# Patient Record
Sex: Female | Born: 1965 | Race: Black or African American | Hispanic: No | Marital: Married | Smoking: Never smoker
Health system: Southern US, Community
[De-identification: ages and names within clinical notes are randomized; demographics above are authoritative.]

## PROBLEM LIST (undated history)

## (undated) DIAGNOSIS — I10 Essential (primary) hypertension: Secondary | ICD-10-CM

## (undated) DIAGNOSIS — E119 Type 2 diabetes mellitus without complications: Secondary | ICD-10-CM

## (undated) DIAGNOSIS — N83209 Unspecified ovarian cyst, unspecified side: Secondary | ICD-10-CM

## (undated) HISTORY — DX: Type 2 diabetes mellitus without complications: E11.9

---

## 2014-01-27 DIAGNOSIS — I10 Essential (primary) hypertension: Secondary | ICD-10-CM | POA: Insufficient documentation

## 2014-08-28 ENCOUNTER — Emergency Department
Admission: EM | Admit: 2014-08-28 | Discharge: 2014-08-28 | Disposition: A | Discharge status: Home / Self Care | Payer: BC Managed Care – PPO | Attending: Emergency Medicine | Admitting: Emergency Medicine

## 2014-08-28 ENCOUNTER — Emergency Department: Payer: BC Managed Care – PPO

## 2014-08-28 ENCOUNTER — Encounter: Payer: Self-pay | Admitting: Emergency Medicine

## 2014-08-28 DIAGNOSIS — R109 Unspecified abdominal pain: Secondary | ICD-10-CM | POA: Insufficient documentation

## 2014-08-28 DIAGNOSIS — R1031 Right lower quadrant pain: Secondary | ICD-10-CM | POA: Diagnosis present

## 2014-08-28 DIAGNOSIS — Z3202 Encounter for pregnancy test, result negative: Secondary | ICD-10-CM | POA: Diagnosis not present

## 2014-08-28 DIAGNOSIS — I10 Essential (primary) hypertension: Secondary | ICD-10-CM | POA: Insufficient documentation

## 2014-08-28 DIAGNOSIS — R102 Pelvic and perineal pain: Secondary | ICD-10-CM

## 2014-08-28 HISTORY — DX: Essential (primary) hypertension: I10

## 2014-08-28 LAB — URINALYSIS COMPLETE WITH MICROSCOPIC (ARMC ONLY)
Bilirubin Urine: NEGATIVE
Glucose, UA: 150 mg/dL — AB
Ketones, ur: NEGATIVE mg/dL
Leukocytes, UA: NEGATIVE
NITRITE: NEGATIVE
PROTEIN: NEGATIVE mg/dL
SPECIFIC GRAVITY, URINE: 1.014 (ref 1.005–1.030)
pH: 7 (ref 5.0–8.0)

## 2014-08-28 LAB — CBC WITH DIFFERENTIAL/PLATELET
BASOS PCT: 1 %
Basophils Absolute: 0.1 10*3/uL (ref 0–0.1)
Eosinophils Absolute: 0.1 10*3/uL (ref 0–0.7)
Eosinophils Relative: 2 %
HCT: 36.2 % (ref 35.0–47.0)
HEMOGLOBIN: 11.5 g/dL — AB (ref 12.0–16.0)
LYMPHS PCT: 31 %
Lymphs Abs: 2.5 10*3/uL (ref 1.0–3.6)
MCH: 23.9 pg — AB (ref 26.0–34.0)
MCHC: 31.9 g/dL — ABNORMAL LOW (ref 32.0–36.0)
MCV: 75 fL — AB (ref 80.0–100.0)
MONO ABS: 0.5 10*3/uL (ref 0.2–0.9)
Monocytes Relative: 7 %
Neutro Abs: 4.8 10*3/uL (ref 1.4–6.5)
Neutrophils Relative %: 59 %
Platelets: 238 10*3/uL (ref 150–440)
RBC: 4.82 MIL/uL (ref 3.80–5.20)
RDW: 16.2 % — ABNORMAL HIGH (ref 11.5–14.5)
WBC: 8.1 10*3/uL (ref 3.6–11.0)

## 2014-08-28 LAB — TROPONIN I

## 2014-08-28 LAB — LIPASE, BLOOD: LIPASE: 24 U/L (ref 22–51)

## 2014-08-28 LAB — COMPREHENSIVE METABOLIC PANEL
ALBUMIN: 3.9 g/dL (ref 3.5–5.0)
ALK PHOS: 58 U/L (ref 38–126)
ALT: 22 U/L (ref 14–54)
AST: 21 U/L (ref 15–41)
Anion gap: 8 (ref 5–15)
BUN: 10 mg/dL (ref 6–20)
CO2: 25 mmol/L (ref 22–32)
CREATININE: 0.69 mg/dL (ref 0.44–1.00)
Calcium: 8.5 mg/dL — ABNORMAL LOW (ref 8.9–10.3)
Chloride: 105 mmol/L (ref 101–111)
Glucose, Bld: 152 mg/dL — ABNORMAL HIGH (ref 65–99)
Potassium: 3.5 mmol/L (ref 3.5–5.1)
Sodium: 138 mmol/L (ref 135–145)
Total Bilirubin: 0.3 mg/dL (ref 0.3–1.2)
Total Protein: 7.6 g/dL (ref 6.5–8.1)

## 2014-08-28 LAB — POCT PREGNANCY, URINE: PREG TEST UR: NEGATIVE

## 2014-08-28 MED ORDER — IOHEXOL 350 MG/ML SOLN
100.0000 mL | Freq: Once | INTRAVENOUS | Status: AC | PRN
Start: 2014-08-28 — End: 2014-08-28
  Administered 2014-08-28: 100 mL via INTRAVENOUS

## 2014-08-28 MED ORDER — HYDROCODONE-ACETAMINOPHEN 5-325 MG PO TABS: 1.0000 | ORAL_TABLET | ORAL | Status: AC | PRN

## 2014-08-28 MED ORDER — IOHEXOL 240 MG/ML SOLN
25.0000 mL | Freq: Once | INTRAMUSCULAR | Status: AC | PRN
  Administered 2014-08-28: 25 mL via ORAL

## 2014-08-28 MED ORDER — HYDROMORPHONE HCL 1 MG/ML IJ SOLN
1.0000 mg | Freq: Once | INTRAMUSCULAR | Status: AC
  Administered 2014-08-28: 1 mg via INTRAVENOUS
  Filled 2014-08-28: qty 1

## 2014-08-28 MED ORDER — MORPHINE SULFATE 4 MG/ML IJ SOLN
4.0000 mg | Freq: Once | INTRAMUSCULAR | Status: AC
  Administered 2014-08-28: 4 mg via INTRAVENOUS
  Filled 2014-08-28: qty 1

## 2014-08-28 MED ORDER — SODIUM CHLORIDE 0.9 % IV BOLUS (SEPSIS)
1000.0000 mL | Freq: Once | INTRAVENOUS | Status: AC
  Administered 2014-08-28: 1000 mL via INTRAVENOUS

## 2014-08-28 MED ORDER — ONDANSETRON HCL 4 MG/2ML IJ SOLN
4.0000 mg | Freq: Once | INTRAMUSCULAR | Status: AC
  Administered 2014-08-28: 4 mg via INTRAVENOUS
  Filled 2014-08-28: qty 2

## 2014-08-28 NOTE — ED Notes (Signed)
Pt here with sudden onset of  Right sided abd pain.

## 2014-08-28 NOTE — Discharge Instructions (Signed)
Please seek medical attention for any high fevers, chest pain, shortness of breath, change in behavior, persistent vomiting, bloody stool or any other new or concerning symptoms. ° °Abdominal Pain, Women °Abdominal (stomach, pelvic, or belly) pain can be caused by many things. It is important to tell your doctor: °· The location of the pain. °· Does it come and go or is it present all the time? °· Are there things that start the pain (eating certain foods, exercise)? °· Are there other symptoms associated with the pain (fever, nausea, vomiting, diarrhea)? °All of this is helpful to know when trying to find the cause of the pain. °CAUSES  °· Stomach: virus or bacteria infection, or ulcer. °· Intestine: appendicitis (inflamed appendix), regional ileitis (Crohn's disease), ulcerative colitis (inflamed colon), irritable bowel syndrome, diverticulitis (inflamed diverticulum of the colon), or cancer of the stomach or intestine. °· Gallbladder disease or stones in the gallbladder. °· Kidney disease, kidney stones, or infection. °· Pancreas infection or cancer. °· Fibromyalgia (pain disorder). °· Diseases of the female organs: °¨ Uterus: fibroid (non-cancerous) tumors or infection. °¨ Fallopian tubes: infection or tubal pregnancy. °¨ Ovary: cysts or tumors. °¨ Pelvic adhesions (scar tissue). °¨ Endometriosis (uterus lining tissue growing in the pelvis and on the pelvic organs). °¨ Pelvic congestion syndrome (female organs filling up with blood just before the menstrual period). °¨ Pain with the menstrual period. °¨ Pain with ovulation (producing an egg). °¨ Pain with an IUD (intrauterine device, birth control) in the uterus. °¨ Cancer of the female organs. °· Functional pain (pain not caused by a disease, may improve without treatment). °· Psychological pain. °· Depression. °DIAGNOSIS  °Your doctor will decide the seriousness of your pain by doing an examination. °· Blood tests. °· X-rays. °· Ultrasound. °· CT scan  (computed tomography, special type of X-ray). °· MRI (magnetic resonance imaging). °· Cultures, for infection. °· Barium enema (dye inserted in the large intestine, to better view it with X-rays). °· Colonoscopy (looking in intestine with a lighted tube). °· Laparoscopy (minor surgery, looking in abdomen with a lighted tube). °· Major abdominal exploratory surgery (looking in abdomen with a large incision). °TREATMENT  °The treatment will depend on the cause of the pain.  °· Many cases can be observed and treated at home. °· Over-the-counter medicines recommended by your caregiver. °· Prescription medicine. °· Antibiotics, for infection. °· Birth control pills, for painful periods or for ovulation pain. °· Hormone treatment, for endometriosis. °· Nerve blocking injections. °· Physical therapy. °· Antidepressants. °· Counseling with a psychologist or psychiatrist. °· Minor or major surgery. °HOME CARE INSTRUCTIONS  °· Do not take laxatives, unless directed by your caregiver. °· Take over-the-counter pain medicine only if ordered by your caregiver. Do not take aspirin because it can cause an upset stomach or bleeding. °· Try a clear liquid diet (broth or water) as ordered by your caregiver. Slowly move to a bland diet, as tolerated, if the pain is related to the stomach or intestine. °· Have a thermometer and take your temperature several times a day, and record it. °· Bed rest and sleep, if it helps the pain. °· Avoid sexual intercourse, if it causes pain. °· Avoid stressful situations. °· Keep your follow-up appointments and tests, as your caregiver orders. °· If the pain does not go away with medicine or surgery, you may try: °¨ Acupuncture. °¨ Relaxation exercises (yoga, meditation). °¨ Group therapy. °¨ Counseling. °SEEK MEDICAL CARE IF:  °· You notice certain foods cause stomach   pain. °· Your home care treatment is not helping your pain. °· You need stronger pain medicine. °· You want your IUD removed. °· You  feel faint or lightheaded. °· You develop nausea and vomiting. °· You develop a rash. °· You are having side effects or an allergy to your medicine. °SEEK IMMEDIATE MEDICAL CARE IF:  °· Your pain does not go away or gets worse. °· You have a fever. °· Your pain is felt only in portions of the abdomen. The right side could possibly be appendicitis. The left lower portion of the abdomen could be colitis or diverticulitis. °· You are passing blood in your stools (bright red or black tarry stools, with or without vomiting). °· You have blood in your urine. °· You develop chills, with or without a fever. °· You pass out. °MAKE SURE YOU:  °· Understand these instructions. °· Will watch your condition. °· Will get help right away if you are not doing well or get worse. °Document Released: 11/14/2006 Document Revised: 06/03/2013 Document Reviewed: 12/04/2008 °ExitCare® Patient Information ©2015 [employer]. This information is not intended to replace advice given to you by your health care provider. Make sure you discuss any questions you have with your health care provider. ° °

## 2014-08-28 NOTE — ED Notes (Signed)
Pt here by EMS with c/o right sided abd pain that started suddenly this am. Pt states that her last BM was today and normal and just finished menstrual cycle. Pt is moaning during assessment and does not make eye contact. Pt is laying on her side and was asked to lay on her back multiple times but would not roll over. Pt was placed on all monitors pt is brady in the 50's at this time and provider is made aware. Pt in NAD at this time will cont to monitor pt at all times.

## 2014-08-28 NOTE — ED Provider Notes (Signed)
[facility] Emergency Department Provider Note  Time seen: 9:59 AM  I have reviewed the triage vital signs and the nursing notes.   HISTORY  Chief Complaint No chief complaint on file.    HPI Mariah Tyler is a 49 y.o. female with a past medical history of hypertension presents the emergency department with 1.5 hours of right lower quadrant pain. According to the patient she awoke with significant right lower quadrant pain which has progressively worsened. Denies fever, denies nausea or vomiting, diarrhea, black or bloody stool, vaginal bleeding or discharge. States her period stopped yesterday. Denies any dysuria. Describes the pain as 10/10, dull/aching in the right lower quadrant. No hematuria.     No past medical history on file.  There are no active problems to display for this patient.   No past surgical history on file.  No current outpatient prescriptions on file.  Allergies Review of patient's allergies indicates not on file.  No family history on file.  Social History History  Substance Use Topics  . Smoking status: Not on file  . Smokeless tobacco: Not on file  . Alcohol Use: Not on file    Review of Systems Constitutional: Negative for fever. Cardiovascular: Negative for chest pain. Respiratory: Negative for shortness of breath. Gastrointestinal: Positive for right lower quadrant abdominal pain. Negative for nausea, vomiting, diarrhea. Genitourinary: Negative for dysuria. Negative for hematuria. Musculoskeletal: Negative for back pain. 10-point ROS otherwise negative.  ____________________________________________   PHYSICAL EXAM:   Constitutional: Alert and oriented. Speaks softly, moans in pain, mild distress due to discomfort Eyes: Normal exam ENT   Mouth/Throat: Mucous membranes are moist. Cardiovascular: Normal rate, regular rhythm. No murmur Respiratory: Normal respiratory effort without tachypnea nor  retractions. Breath sounds are clear and equal bilaterally. No wheezes/rales/rhonchi. Gastrointestinal: Soft, moderate right lower quadrant abdominal tenderness palpation. No rebound or guarding. Musculoskeletal: Nontender with normal range of motion in all extremities Neurologic:  Normal speech and language. No gross focal neurologic deficits  Skin:  Skin is warm, dry and intact.  Psychiatric: Mood and affect are normal. Speech and behavior are normal.  ____________________________________________   RADIOLOGY  CT shows bilateral ovarian cysts, with some fluid around the right ovary.   ____________________________________________   INITIAL IMPRESSION / ASSESSMENT AND PLAN / ED COURSE  Pertinent labs & imaging results that were available during my care of the patient were reviewed by me and considered in my medical decision making (see chart for details).  Patient with 1.5 hours of right lower quadrant abdominal pain. Describes the pain isn't significant. We will check labs, treat pain, IV hydrate, and closely monitor in the emergency department.  CT shows bilateral ovarian cyst, with fluid around the right ovary. Patient states her pain is improved, but continues to have moderate palpation of the right lower quadrant. Of note the appendix was normal on CT scan. We'll proceed with an ovarian ultrasound to help further evaluate and rule out torsion. Discussed with the patient is agreeable to plan.  Patient care signed out to Dr. Derrill Kay.  ____________________________________________   FINAL CLINICAL IMPRESSION(S) / ED DIAGNOSES  Right lower quadrant abdominal pain   Minna Antis, MD 08/28/14 1526

## 2014-10-20 ENCOUNTER — Encounter: Payer: Self-pay | Admitting: Emergency Medicine

## 2014-10-20 ENCOUNTER — Ambulatory Visit
Admission: EM | Admit: 2014-10-20 | Discharge: 2014-10-20 | Disposition: A | Discharge status: Home / Self Care | Payer: BC Managed Care – PPO | Source: Home / Self Care | Attending: Family Medicine | Admitting: Family Medicine

## 2014-10-20 ENCOUNTER — Emergency Department: Payer: BC Managed Care – PPO

## 2014-10-20 ENCOUNTER — Ambulatory Visit: Payer: BC Managed Care – PPO

## 2014-10-20 DIAGNOSIS — I10 Essential (primary) hypertension: Secondary | ICD-10-CM | POA: Diagnosis not present

## 2014-10-20 DIAGNOSIS — R1030 Lower abdominal pain, unspecified: Secondary | ICD-10-CM | POA: Diagnosis not present

## 2014-10-20 DIAGNOSIS — R109 Unspecified abdominal pain: Secondary | ICD-10-CM | POA: Insufficient documentation

## 2014-10-20 LAB — CBC WITH DIFFERENTIAL/PLATELET
BASOS PCT: 0 %
Basophils Absolute: 0 10*3/uL (ref 0–0.1)
Eosinophils Absolute: 0 10*3/uL (ref 0–0.7)
Eosinophils Relative: 0 %
HEMATOCRIT: 39.3 % (ref 35.0–47.0)
HEMOGLOBIN: 12.7 g/dL (ref 12.0–16.0)
Lymphocytes Relative: 13 %
Lymphs Abs: 1.2 10*3/uL (ref 1.0–3.6)
MCH: 24.1 pg — ABNORMAL LOW (ref 26.0–34.0)
MCHC: 32.3 g/dL (ref 32.0–36.0)
MCV: 74.6 fL — ABNORMAL LOW (ref 80.0–100.0)
MONOS PCT: 4 %
Monocytes Absolute: 0.4 10*3/uL (ref 0.2–0.9)
NEUTROS ABS: 7.9 10*3/uL — AB (ref 1.4–6.5)
NEUTROS PCT: 83 %
Platelets: 258 10*3/uL (ref 150–440)
RBC: 5.26 MIL/uL — AB (ref 3.80–5.20)
RDW: 16.3 % — ABNORMAL HIGH (ref 11.5–14.5)
WBC: 9.5 10*3/uL (ref 3.6–11.0)

## 2014-10-20 LAB — PREGNANCY, URINE: Preg Test, Ur: NEGATIVE

## 2014-10-20 LAB — COMPREHENSIVE METABOLIC PANEL
ALT: 20 U/L (ref 14–54)
AST: 21 U/L (ref 15–41)
Albumin: 4.3 g/dL (ref 3.5–5.0)
Alkaline Phosphatase: 59 U/L (ref 38–126)
Anion gap: 8 (ref 5–15)
BILIRUBIN TOTAL: 0.5 mg/dL (ref 0.3–1.2)
BUN: 10 mg/dL (ref 6–20)
CO2: 27 mmol/L (ref 22–32)
Calcium: 8.7 mg/dL — ABNORMAL LOW (ref 8.9–10.3)
Chloride: 101 mmol/L (ref 101–111)
Creatinine, Ser: 0.86 mg/dL (ref 0.44–1.00)
GFR calc Af Amer: >60 mL/min (ref >60)
GFR calc non Af Amer: >60 mL/min (ref >60)
Glucose, Bld: 140 mg/dL — ABNORMAL HIGH (ref 65–99)
Potassium: 4.2 mmol/L (ref 3.5–5.1)
Sodium: 136 mmol/L (ref 135–145)
TOTAL PROTEIN: 8.3 g/dL — AB (ref 6.5–8.1)

## 2014-10-20 LAB — URINALYSIS COMPLETE WITH MICROSCOPIC (ARMC ONLY)
Bilirubin Urine: NEGATIVE
Glucose, UA: NEGATIVE mg/dL
KETONES UR: NEGATIVE mg/dL
Leukocytes, UA: NEGATIVE
Nitrite: NEGATIVE
PROTEIN: NEGATIVE mg/dL
Specific Gravity, Urine: 1.025 (ref 1.005–1.030)
pH: 5.5 (ref 5.0–8.0)

## 2014-10-20 MED ORDER — KETOROLAC TROMETHAMINE 60 MG/2ML IM SOLN
60.0000 mg | Freq: Once | INTRAMUSCULAR | Status: AC
  Administered 2014-10-20: 60 mg via INTRAMUSCULAR

## 2014-10-20 NOTE — ED Notes (Signed)
Pt states I was given Morphine at the [facility] ER and I got swelling in my face. I took Benadryl for 3 days. Pt speech is slow and she is over all lethargic.

## 2014-10-20 NOTE — ED Notes (Signed)
Patient observed getting off of bench and into WC without difficulty. Gait noted to be steady. Patient to [place] at this time.

## 2014-10-20 NOTE — ED Notes (Signed)
Patient c/o left sided lower abdominal pain about an hour ago.  Patient took Hydrocodone earlier today.

## 2014-10-20 NOTE — Discharge Instructions (Signed)
Abdominal Pain, Women °Abdominal (stomach, pelvic, or belly) pain can be caused by many things. It is important to tell your doctor: °· The location of the pain. °· Does it come and go or is it present all the time? °· Are there things that start the pain (eating certain foods, exercise)? °· Are there other symptoms associated with the pain (fever, nausea, vomiting, diarrhea)? °All of this is helpful to know when trying to find the cause of the pain. °CAUSES  °· Stomach: virus or bacteria infection, or ulcer. °· Intestine: appendicitis (inflamed appendix), regional ileitis (Crohn's disease), ulcerative colitis (inflamed colon), irritable bowel syndrome, diverticulitis (inflamed diverticulum of the colon), or cancer of the stomach or intestine. °· Gallbladder disease or stones in the gallbladder. °· Kidney disease, kidney stones, or infection. °· Pancreas infection or cancer. °· Fibromyalgia (pain disorder). °· Diseases of the female organs: °¨ Uterus: fibroid (non-cancerous) tumors or infection. °¨ Fallopian tubes: infection or tubal pregnancy. °¨ Ovary: cysts or tumors. °¨ Pelvic adhesions (scar tissue). °¨ Endometriosis (uterus lining tissue growing in the pelvis and on the pelvic organs). °¨ Pelvic congestion syndrome (female organs filling up with blood just before the menstrual period). °¨ Pain with the menstrual period. °¨ Pain with ovulation (producing an egg). °¨ Pain with an IUD (intrauterine device, birth control) in the uterus. °¨ Cancer of the female organs. °· Functional pain (pain not caused by a disease, may improve without treatment). °· Psychological pain. °· Depression. °DIAGNOSIS  °Your doctor will decide the seriousness of your pain by doing an examination. °· Blood tests. °· X-rays. °· Ultrasound. °· CT scan (computed tomography, special type of X-ray). °· MRI (magnetic resonance imaging). °· Cultures, for infection. °· Barium enema (dye inserted in the large intestine, to better view it with  X-rays). °· Colonoscopy (looking in intestine with a lighted tube). °· Laparoscopy (minor surgery, looking in abdomen with a lighted tube). °· Major abdominal exploratory surgery (looking in abdomen with a large incision). °TREATMENT  °The treatment will depend on the cause of the pain.  °· Many cases can be observed and treated at home. °· Over-the-counter medicines recommended by your caregiver. °· Prescription medicine. °· Antibiotics, for infection. °· Birth control pills, for painful periods or for ovulation pain. °· Hormone treatment, for endometriosis. °· Nerve blocking injections. °· Physical therapy. °· Antidepressants. °· Counseling with a psychologist or psychiatrist. °· Minor or major surgery. °HOME CARE INSTRUCTIONS  °· Do not take laxatives, unless directed by your caregiver. °· Take over-the-counter pain medicine only if ordered by your caregiver. Do not take aspirin because it can cause an upset stomach or bleeding. °· Try a clear liquid diet (broth or water) as ordered by your caregiver. Slowly move to a bland diet, as tolerated, if the pain is related to the stomach or intestine. °· Have a thermometer and take your temperature several times a day, and record it. °· Bed rest and sleep, if it helps the pain. °· Avoid sexual intercourse, if it causes pain. °· Avoid stressful situations. °· Keep your follow-up appointments and tests, as your caregiver orders. °· If the pain does not go away with medicine or surgery, you may try: °¨ Acupuncture. °¨ Relaxation exercises (yoga, meditation). °¨ Group therapy. °¨ Counseling. °SEEK MEDICAL CARE IF:  °· You notice certain foods cause stomach pain. °· Your home care treatment is not helping your pain. °· You need stronger pain medicine. °· You want your IUD removed. °· You feel faint or   lightheaded. °· You develop nausea and vomiting. °· You develop a rash. °· You are having side effects or an allergy to your medicine. °SEEK IMMEDIATE MEDICAL CARE IF:  °· Your  pain does not go away or gets worse. °· You have a fever. °· Your pain is felt only in portions of the abdomen. The right side could possibly be appendicitis. The left lower portion of the abdomen could be colitis or diverticulitis. °· You are passing blood in your stools (bright red or black tarry stools, with or without vomiting). °· You have blood in your urine. °· You develop chills, with or without a fever. °· You pass out. °MAKE SURE YOU:  °· Understand these instructions. °· Will watch your condition. °· Will get help right away if you are not doing well or get worse. °Document Released: 11/14/2006 Document Revised: 06/03/2013 Document Reviewed: 12/04/2008 °ExitCare® Patient Information ©2015 [employer]. This information is not intended to replace advice given to you by your health care provider. Make sure you discuss any questions you have with your health care provider. ° °

## 2014-10-20 NOTE — ED Notes (Signed)
Pt to triage via w/c accomp by family; pt with head thrown back & eyes closed, no distress; pt does not respond when spoken to (as of note pt began with this behavior after speaking with 1st nurse and being notified of current wait time); after calling name loudly several times by this nurse and husband, pt sits up and begins mumbling answers very softly; pt reports left lower abd pain with no accomp symptoms; husband st they just left [facility] for such, received labs/urinalysis/xray with normal findings; received injection of pain medication, but due to persistent pain was sent to ED for evaluation

## 2014-10-20 NOTE — ED Notes (Signed)
Patient has moved from her WC on a bench; observed in a LEFT side lying position with NAD noted. Family with patient at this time.

## 2014-10-20 NOTE — ED Notes (Addendum)
Asked by triage nurse to observe patient while in the WR. Patient observed sitting in WC; no family with patient at this time. Patient conscious and alert; observed self repositioning herself in the WC. After several minutes patient's husband returns and patient is moved from triage area in lobby to far side of lobby near entrance D. Patient relations also asked by this RN to assist with monitoring. Will continue to monitor.

## 2014-10-20 NOTE — ED Provider Notes (Addendum)
CSN: [id number]     Arrival date & time 10/20/14  1613 History   First MD Initiated Contact with Patient 10/20/14 1707     Chief Complaint  Patient presents with  . Abdominal Pain   (Consider location/radiation/quality/duration/timing/severity/associated sxs/prior Treatment) HPI Comments: 49 yo female with a h/o chronic intermittent abdominal pains for which she's had recent workups for the past 2-3 months, presents with c/o acute onset of severe lower abdominal pain about 2 hours ago, worse on the left side. Denies any fevers, chills, nausea, vomiting, diarrhea, constipation, melena, hematochezia, urinary symptoms. States pain is constant. Took a hydrocodone earlier without relief.   The history is provided by the patient.    Past Medical History  Diagnosis Date  . Hypertension    History reviewed. No pertinent past surgical history. History reviewed. No pertinent family history. Social History  Substance Use Topics  . Smoking status: Never Smoker   . Smokeless tobacco: None  . Alcohol Use: No   OB History    No data available     Review of Systems  Allergies  Morphine and related  Home Medications   Prior to Admission medications   Medication Sig Start Date End Date Taking? Authorizing Provider  HYDROcodone-acetaminophen (NORCO/VICODIN) 5-325 MG per tablet Take 1 tablet by mouth every 4 (four) hours as needed for moderate pain. 08/28/14   Minna Antis, MD   Meds Ordered and Administered this Visit   Medications  ketorolac (TORADOL) injection 60 mg (60 mg Intramuscular Given 10/20/14 1720)    BP 116/50 mmHg  Pulse 70  Temp(Src) 97.8 F (36.6 C) (Oral)  Resp 16  Ht  (1.676 m)  Wt 200 lb (90.719 kg)  BMI 32.30 kg/m2  SpO2 100%  LMP 09/22/2014 (Approximate) No data found.   Physical Exam  Constitutional: She appears well-developed and well-nourished. No distress.  HENT:  Head: Normocephalic and atraumatic.  Neck: Neck supple.  Cardiovascular:  Normal rate and regular rhythm.   Pulmonary/Chest: Effort normal and breath sounds normal. No respiratory distress. She has no wheezes. She has no rales.  Abdominal: Soft. Bowel sounds are normal. She exhibits no distension and no mass. There is tenderness (bilateral lower abdominal tenderness to palpation (left greater than right)). There is no rebound and no guarding.  Skin: No rash noted. She is not diaphoretic.  Nursing note and vitals reviewed.   ED Course  Procedures (including critical care time)  Labs Review Labs Reviewed  URINALYSIS COMPLETEWITH MICROSCOPIC ([facility] ONLY) - Abnormal; Notable for the following:    Hgb urine dipstick TRACE (*)    Squamous Epithelial / LPF 6-30 (*)    All other components within normal limits  CBC WITH DIFFERENTIAL/PLATELET - Abnormal; Notable for the following:    RBC 5.26 (*)    MCV 74.6 (*)    MCH 24.1 (*)    RDW 16.3 (*)    Neutro Abs 7.9 (*)    All other components within normal limits  COMPREHENSIVE METABOLIC PANEL - Abnormal; Notable for the following:    Glucose, Bld 140 (*)    Calcium 8.7 (*)    Total Protein 8.3 (*)    All other components within normal limits  PREGNANCY, URINE    Imaging Review Dg Abd 2 Views  10/20/2014   CLINICAL DATA:  Acute left abdominal pain for 1 day.  EXAM: ABDOMEN - 2 VIEW  COMPARISON:  08/28/1998 scratch de 08/28/2014 CT  FINDINGS: The bowel gas pattern is normal. There is no  evidence of free air. No radio-opaque calculi or other significant radiographic abnormality is seen.  IMPRESSION: Negative.   Electronically Signed   By: Harmon Pier M.D.   On: 10/20/2014 18:01     Visual Acuity Review  Right Eye Distance:   Left Eye Distance:   Bilateral Distance:    Right Eye Near:   Left Eye Near:    Bilateral Near:         MDM   1. Lower abdominal pain   (severe; unknown etiology)   Plan: 1. Test/x-ray results (normal/negative) and diagnosis reviewed with patient 2. Patient given Toradol   IM x1 in clinic; no improvement; c/o severe pain 3. Recommend patient go to ED for further evaluation and management  Payton Mccallum, MD 10/20/14 2007  Payton Mccallum, MD 10/20/14 2007

## 2014-10-21 ENCOUNTER — Emergency Department
Admission: EM | Admit: 2014-10-21 | Discharge: 2014-10-21 | Discharge status: Against Medical Advice | Payer: BC Managed Care – PPO | Attending: Emergency Medicine | Admitting: Emergency Medicine

## 2014-10-21 DIAGNOSIS — R52 Pain, unspecified: Secondary | ICD-10-CM

## 2014-10-21 HISTORY — DX: Unspecified ovarian cyst, unspecified side: N83.209

## 2015-09-21 IMAGING — US US PELVIS COMPLETE
1 series · 13 of 25 positions shown · non-contrast
Comparison: CT scan dated 08/28/2014

CLINICAL DATA: Right lower quadrant pain. Enlarged ovaries seen on
CT scan today.

EXAM:
TRANSABDOMINAL AND TRANSVAGINAL ULTRASOUND OF PELVIS
DOPPLER ULTRASOUND OF OVARIES
TECHNIQUE: Both transabdominal and transvaginal ultrasound examinations of the
pelvis were performed. Transabdominal technique was performed for
global imaging of the pelvis including uterus, ovaries, adnexal
regions, and pelvic cul-de-sac.
It was necessary to proceed with endovaginal exam following the
transabdominal exam to visualize the ovaries. Color and duplex
Doppler ultrasound was utilized to evaluate blood flow to the
ovaries.

[Series 1: us pelvis complete · 0.24mm/px · 13 of 89 slices shown]
[im 1/89]
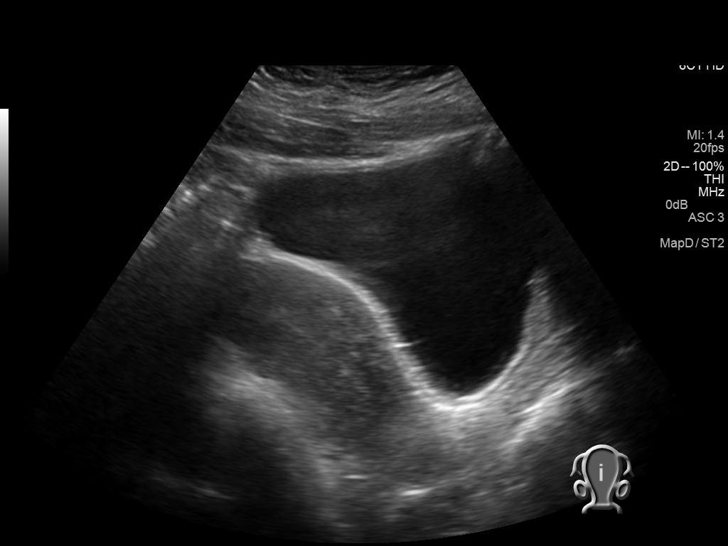
[im 8/89]
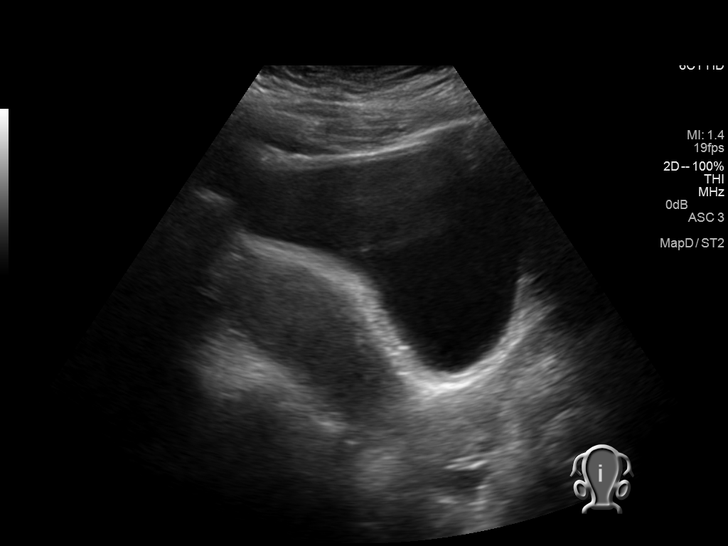
[im 15/89]
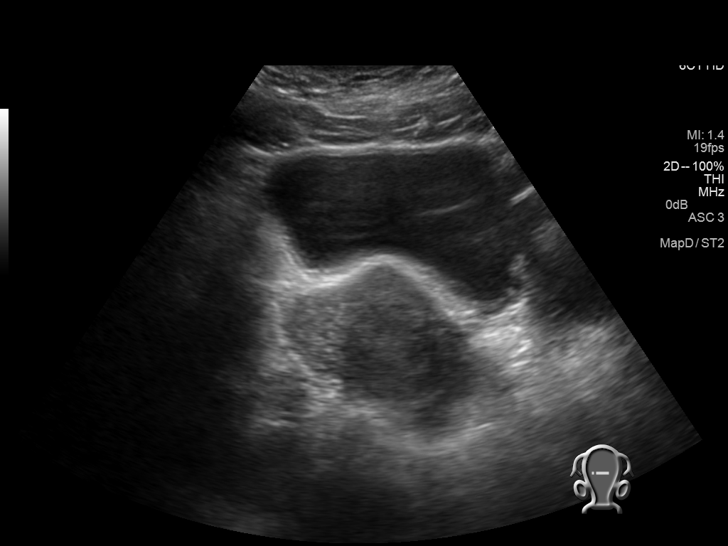
[im 23/89]
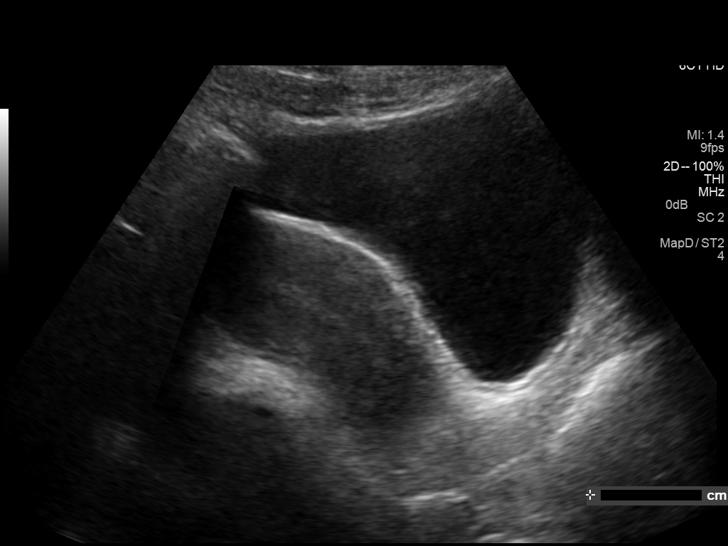
[im 30/89]
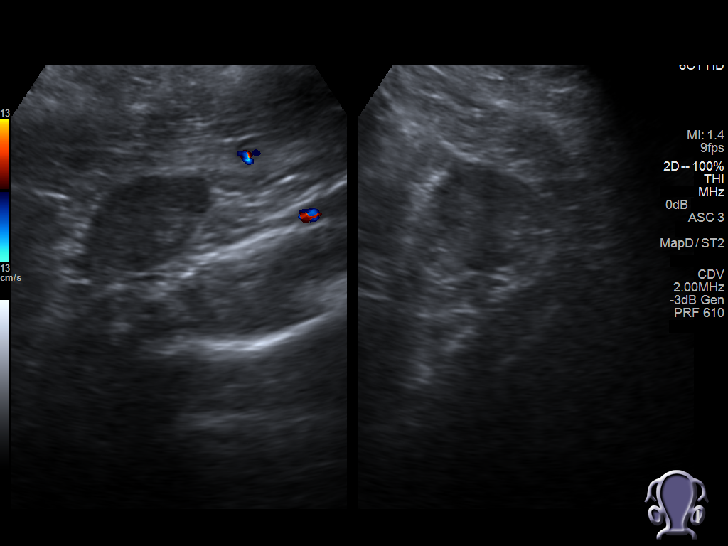
[im 37/89]
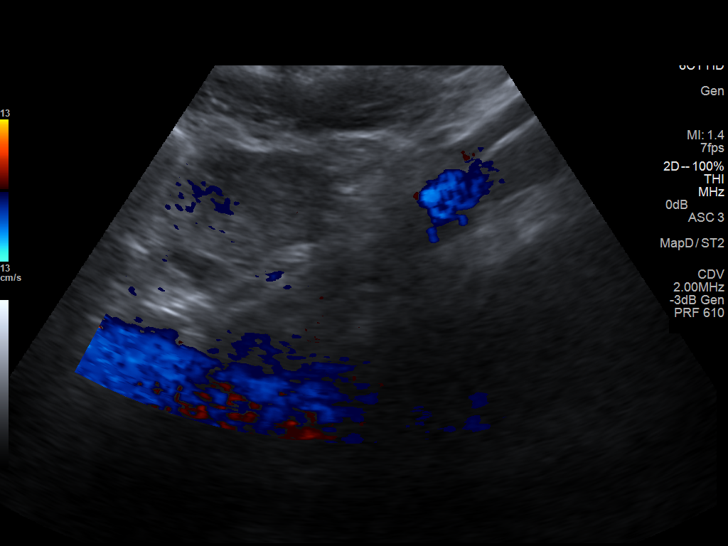
[im 45/89]
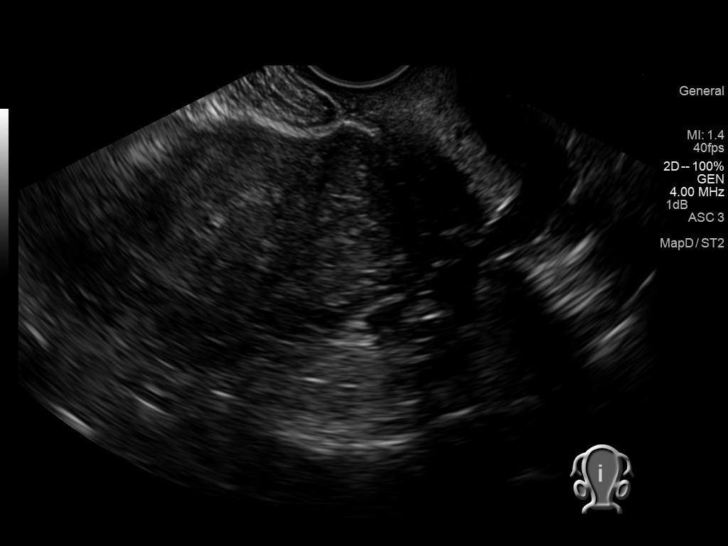
[im 52/89]
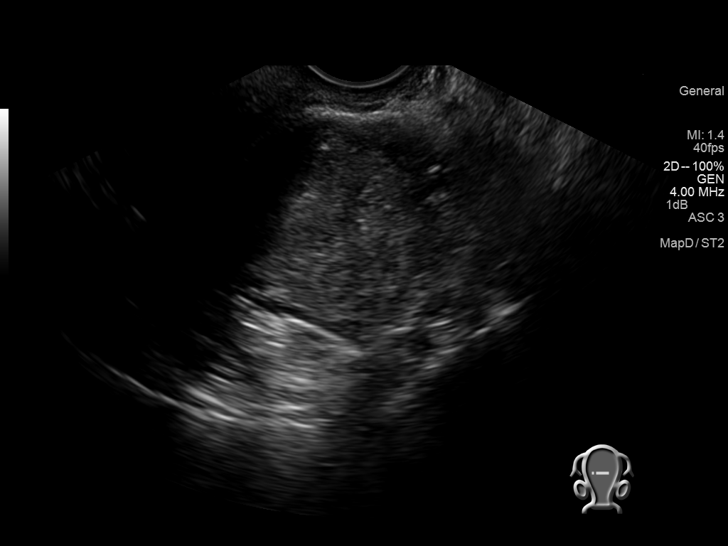
[im 59/89]
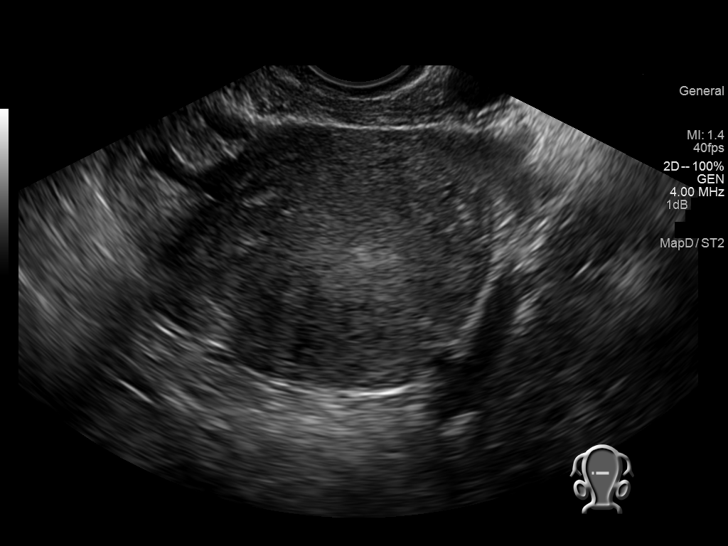
[im 67/89]
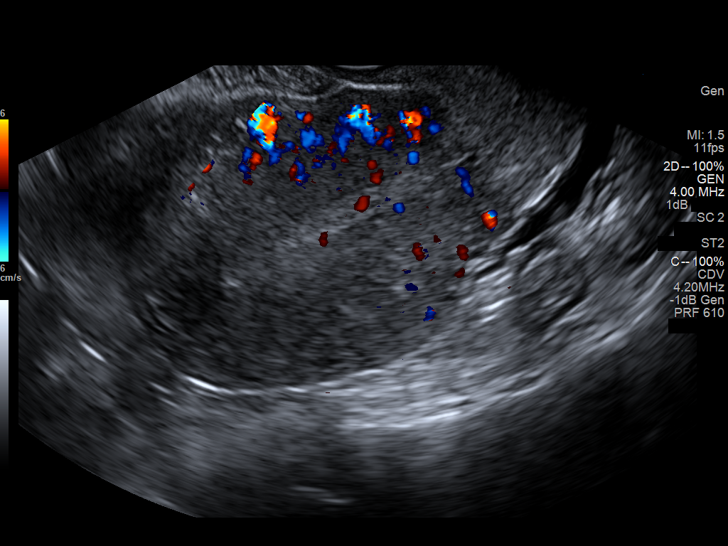
[im 74/89]
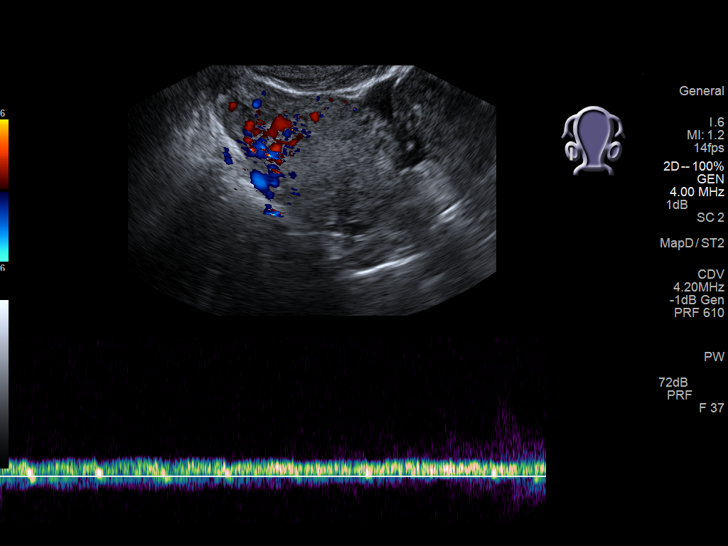
[im 81/89]
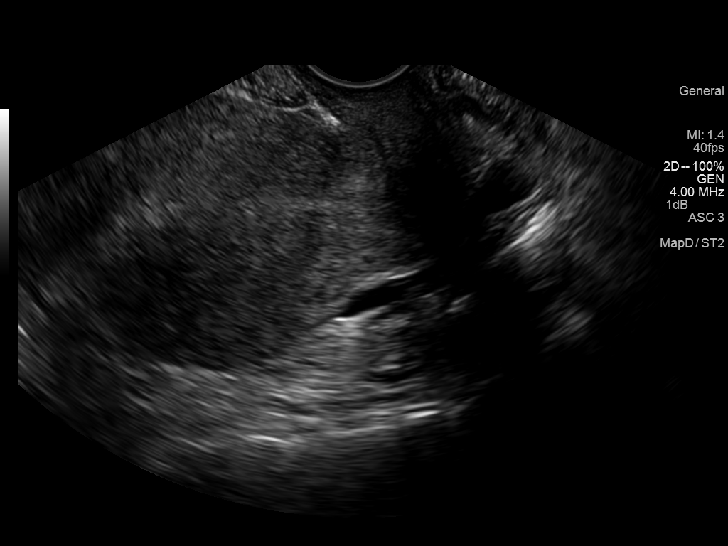
[im 89/89]
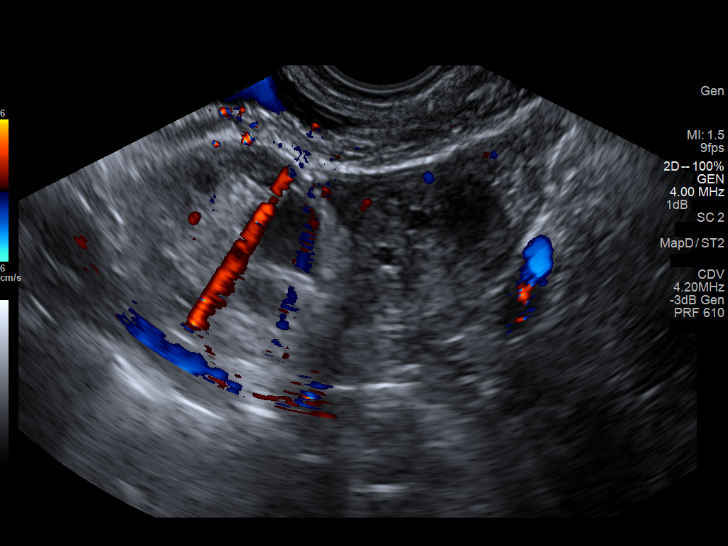

[13 of 25 positions shown; findings below may reference images not displayed]

FINDINGS: Uterus

Measurements: 9.6 x 5.7 x 6.3 cm. No fibroids or other mass
visualized.

Endometrium

Thickness: 6 mm.  No focal abnormality visualized.

Right ovary

Measurements: 5.6 x 3.0 x 5.0 cm. The ovary is enlarged and slightly
echogenic with multiple sub cm cysts or follicles.

Left ovary

Measurements: 4.9 x 2.9 x 3.1 cm. Multiple subcentimeter cysts or
follicles.

Pulsed Doppler evaluation of both ovaries demonstrates normal
low-resistance arterial and venous waveforms.

Other findings

There is small amount of free fluid in the pelvis.
IMPRESSION: Both ovaries are prominent with what appear to be multiple follicles
within the substance of the ovaries. The configuration of the
follicles is not typical for polycystic ovary disease however.

There is normal perfusion to both ovaries.

## 2015-12-03 IMAGING — CR DG ABDOMEN 2V
3 series · 3 of 3 positions shown · non-contrast
Comparison: 08/28/1998 scratch de 08/28/2014 CT

CLINICAL DATA: Acute left abdominal pain for 1 day.

EXAM:
ABDOMEN - 2 VIEW

[abdomen erect]
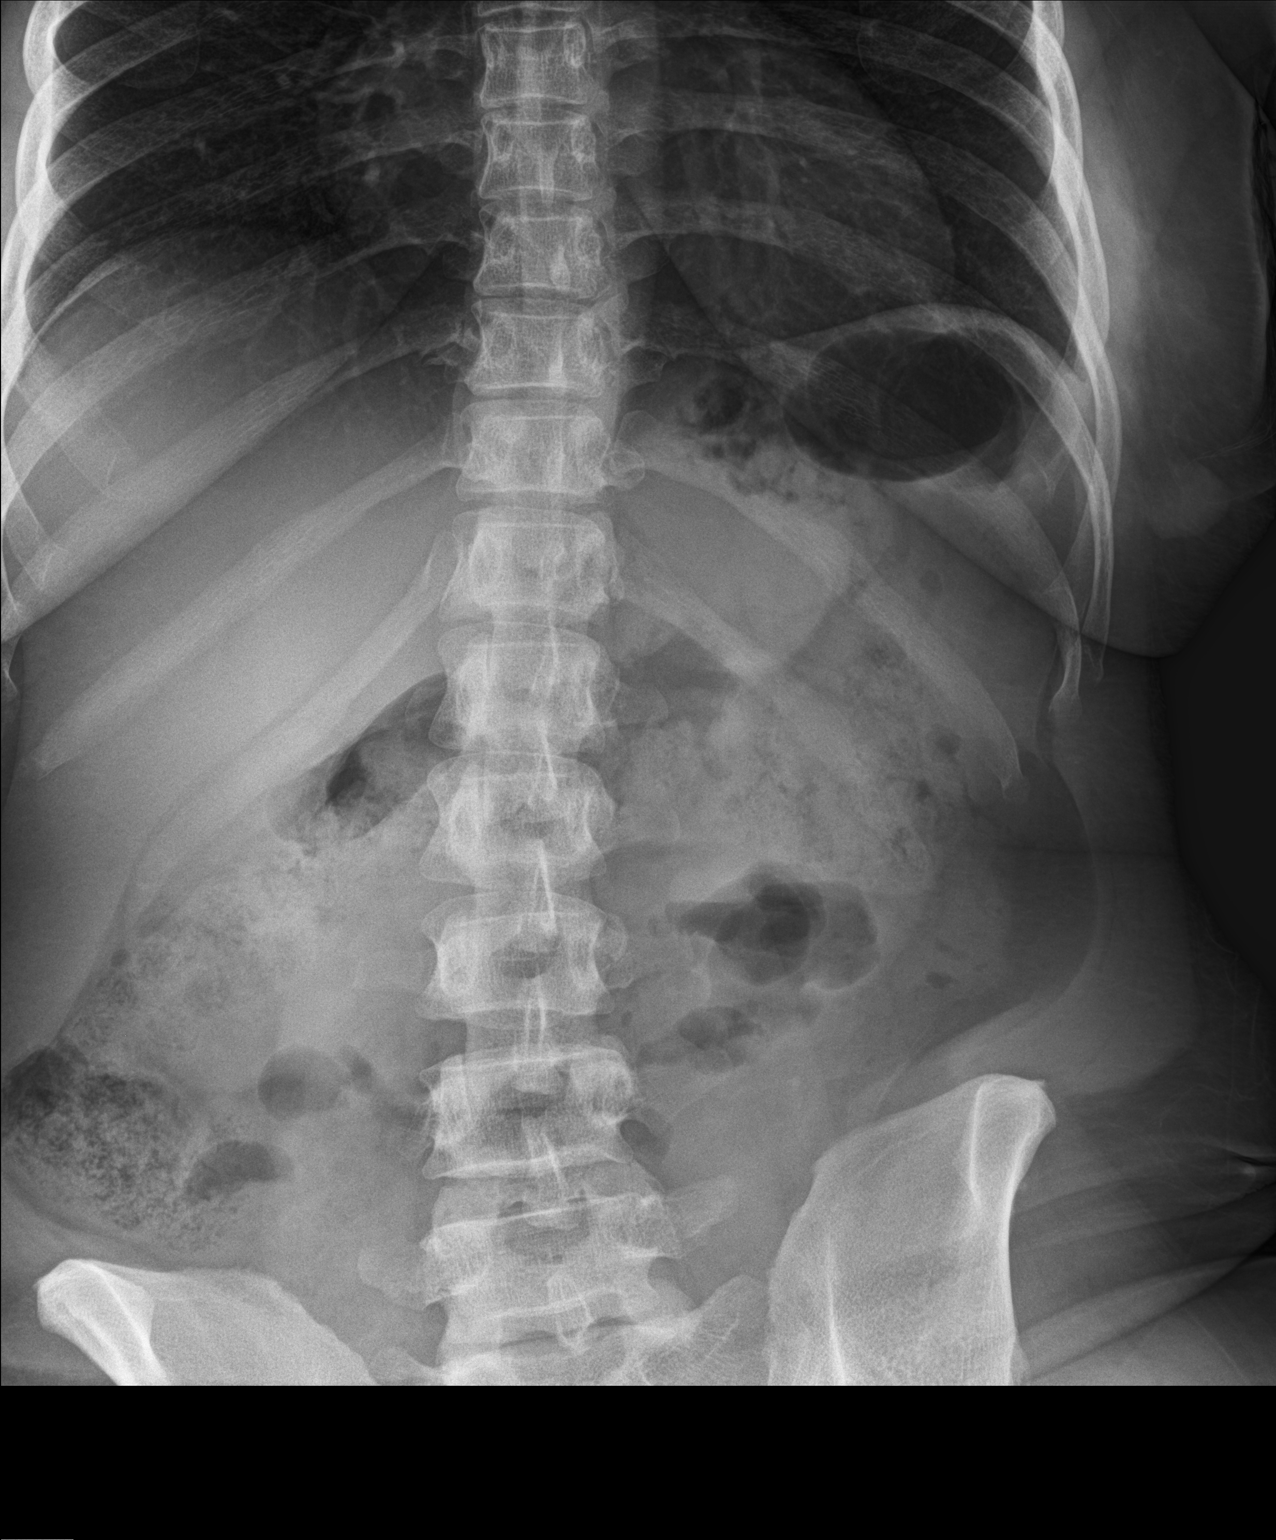

[abdomen supine (1 of 2)]
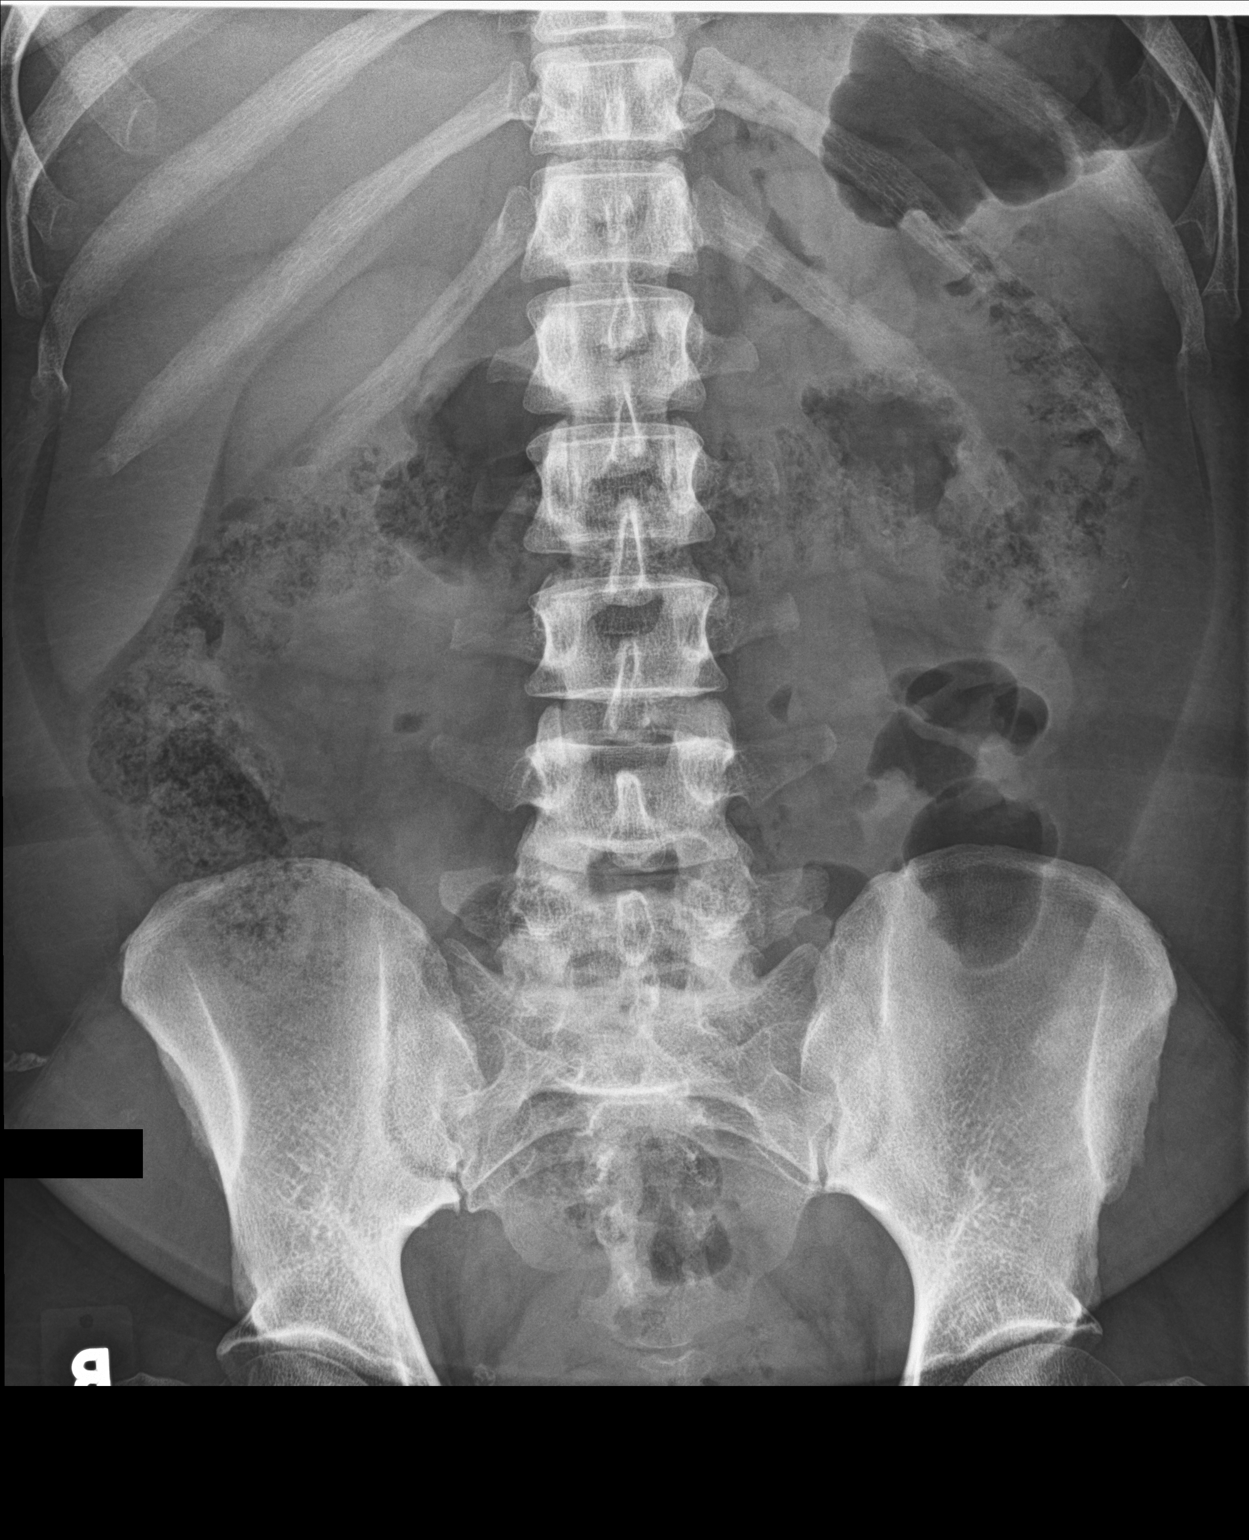

[abdomen supine (2 of 2)]
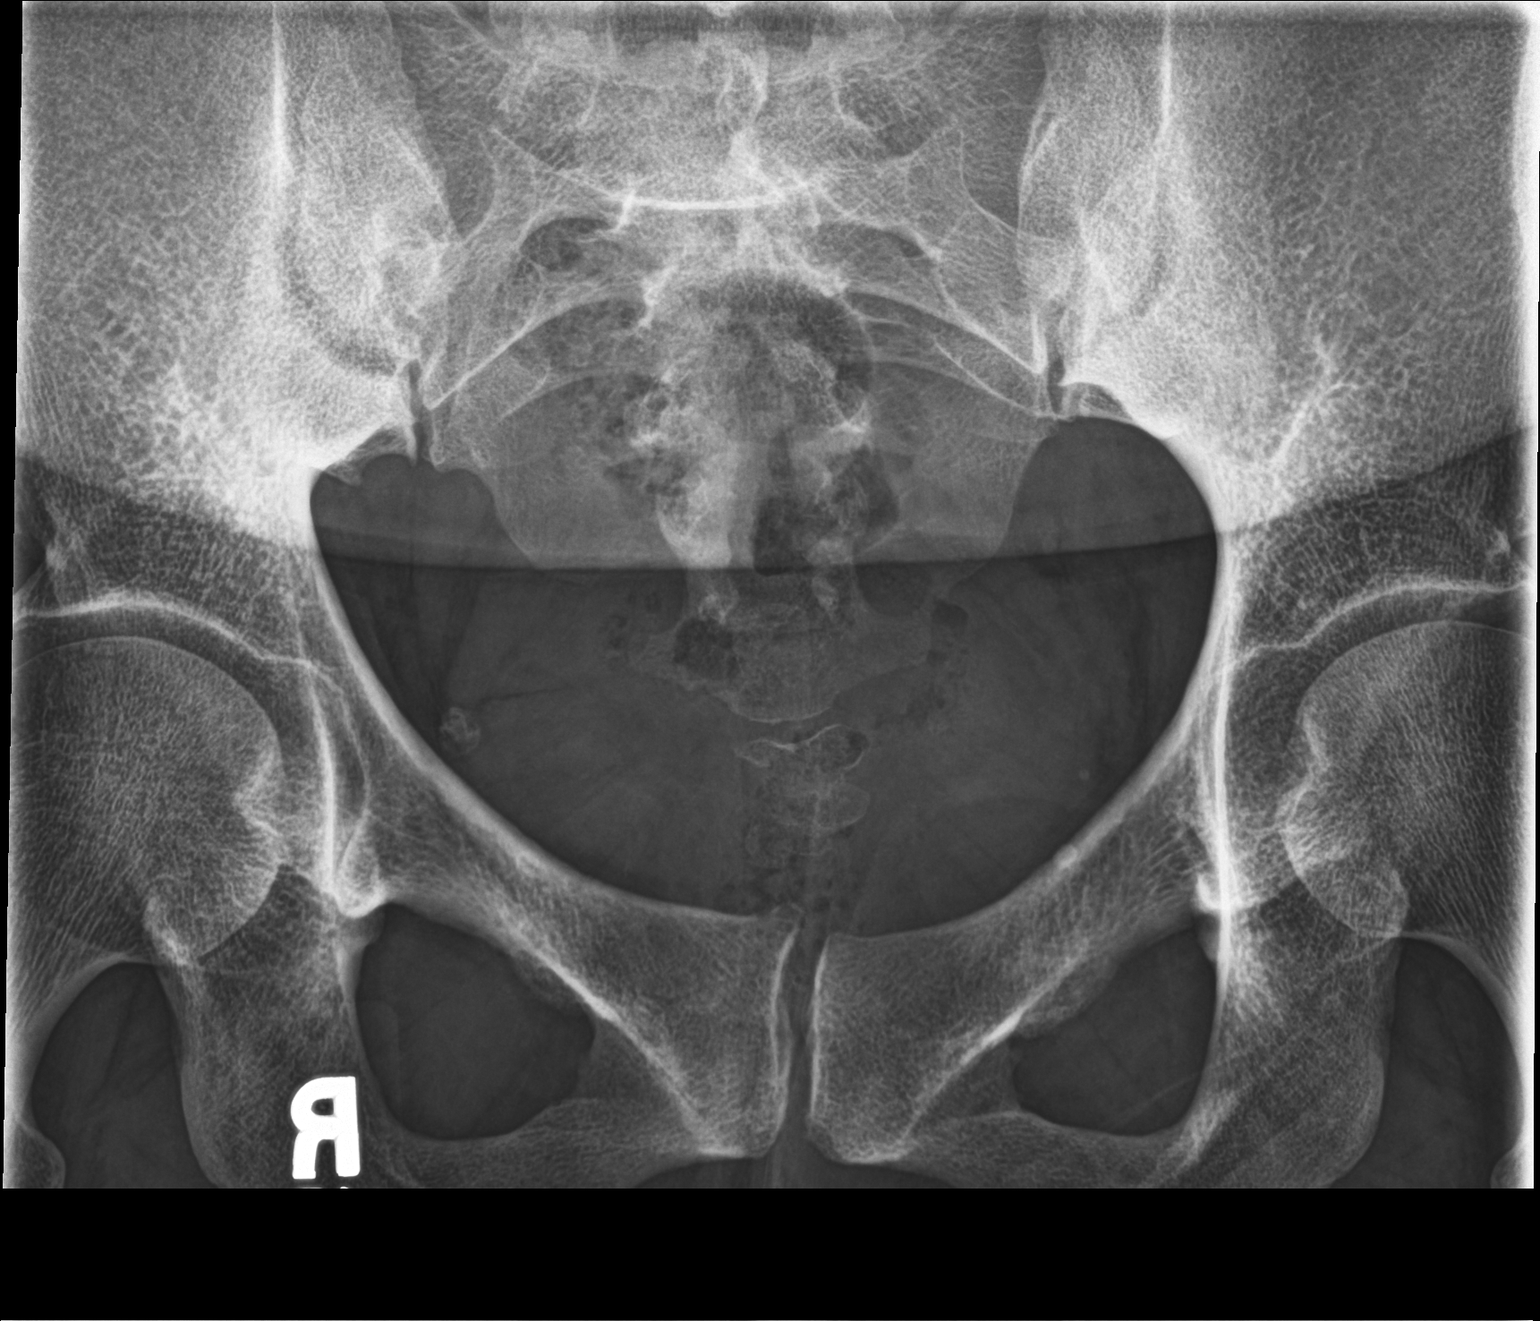

[3 of 3 positions shown; findings below may reference images not displayed]

FINDINGS: The bowel gas pattern is normal. There is no evidence of free air.
No radio-opaque calculi or other significant radiographic
abnormality is seen.
IMPRESSION: Negative.

## 2020-09-16 ENCOUNTER — Other Ambulatory Visit: Payer: Self-pay

## 2022-12-10 LAB — EXTERNAL GENERIC LAB PROCEDURE

## 2023-03-09 ENCOUNTER — Telehealth: Payer: Self-pay

## 2023-03-09 NOTE — Telephone Encounter (Signed)
 Patient referred to [place] 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 Almarie Metro, RN

## 2023-03-20 ENCOUNTER — Telehealth: Payer: Self-pay

## 2023-03-20 DIAGNOSIS — Z227 Latent tuberculosis: Secondary | ICD-10-CM | POA: Insufficient documentation

## 2023-03-20 NOTE — Telephone Encounter (Signed)
 Mailed closure letter. Copy sent for scanning Richmond Campbell, RN

## 2023-03-21 ENCOUNTER — Ambulatory Visit: Payer: Self-pay

## 2023-03-21 VITALS — Ht 65.0 in | Wt 194.0 lb

## 2023-03-21 DIAGNOSIS — Z227 Latent tuberculosis: Secondary | ICD-10-CM

## 2023-03-21 NOTE — Progress Notes (Unsigned)
 TC with patient to complete EPI d/t +QFT. Patient had normal CXR on 11/14/22 [facility]). States testing was done for school. 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 This is the first QFT she has ever had. Discussed LTBI vs Active TB. TB RN offered repeat QFT to patient and LTBI tx. Patient would like to think about everything and call back next week. If patient wishes to proceed with LTBI tx, a current weight will be obtained.Richmond Campbell, RN    +QFT 11/07/22 ([facility]) CXR 11/14/22 [facility]) EPI 03/21/23

## 2023-03-22 DIAGNOSIS — R7612 Nonspecific reaction to cell mediated immunity measurement of gamma interferon antigen response without active tuberculosis: Secondary | ICD-10-CM | POA: Insufficient documentation

## 2023-03-22 DIAGNOSIS — K219 Gastro-esophageal reflux disease without esophagitis: Secondary | ICD-10-CM | POA: Insufficient documentation

## 2023-03-22 DIAGNOSIS — Z6835 Body mass index (BMI) 35.0-35.9, adult: Secondary | ICD-10-CM | POA: Insufficient documentation

## 2023-03-22 DIAGNOSIS — E119 Type 2 diabetes mellitus without complications: Secondary | ICD-10-CM | POA: Insufficient documentation

## 2023-03-22 DIAGNOSIS — E1165 Type 2 diabetes mellitus with hyperglycemia: Secondary | ICD-10-CM | POA: Insufficient documentation

## 2023-03-22 DIAGNOSIS — E1121 Type 2 diabetes mellitus with diabetic nephropathy: Secondary | ICD-10-CM | POA: Insufficient documentation

## 2023-03-22 DIAGNOSIS — E782 Mixed hyperlipidemia: Secondary | ICD-10-CM | POA: Insufficient documentation

## 2023-04-02 ENCOUNTER — Telehealth: Payer: Self-pay

## 2023-04-03 NOTE — Telephone Encounter (Signed)
 LTBI vs Active TB information mailed to patient.  TB RN has made multiple attempts to discuss if patient wants to repeat QFT or proceed with LTBI tx. Closing to TB f/u. Patient has TB RN's contact info. Richmond Campbell, RN
